# Patient Record
Sex: Female | Born: 2000 | Race: White | Hispanic: No | Marital: Single | Smoking: Never smoker
Health system: Southern US, Community
[De-identification: ages and names within clinical notes are randomized; demographics above are authoritative.]

## PROBLEM LIST (undated history)

## (undated) DIAGNOSIS — J45909 Unspecified asthma, uncomplicated: Secondary | ICD-10-CM

---
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
## 2021-10-02 IMAGING — CR DG CHEST 2V
1 series · 2 of 2 positions shown · non-contrast
Comparison: None.

CLINICAL DATA: Chest pain.

EXAM:
CHEST - 2 VIEW

[Series 1: dg chest 2 view · 0.14mm/px · 2 of 2 slices shown]
[im 1/2]
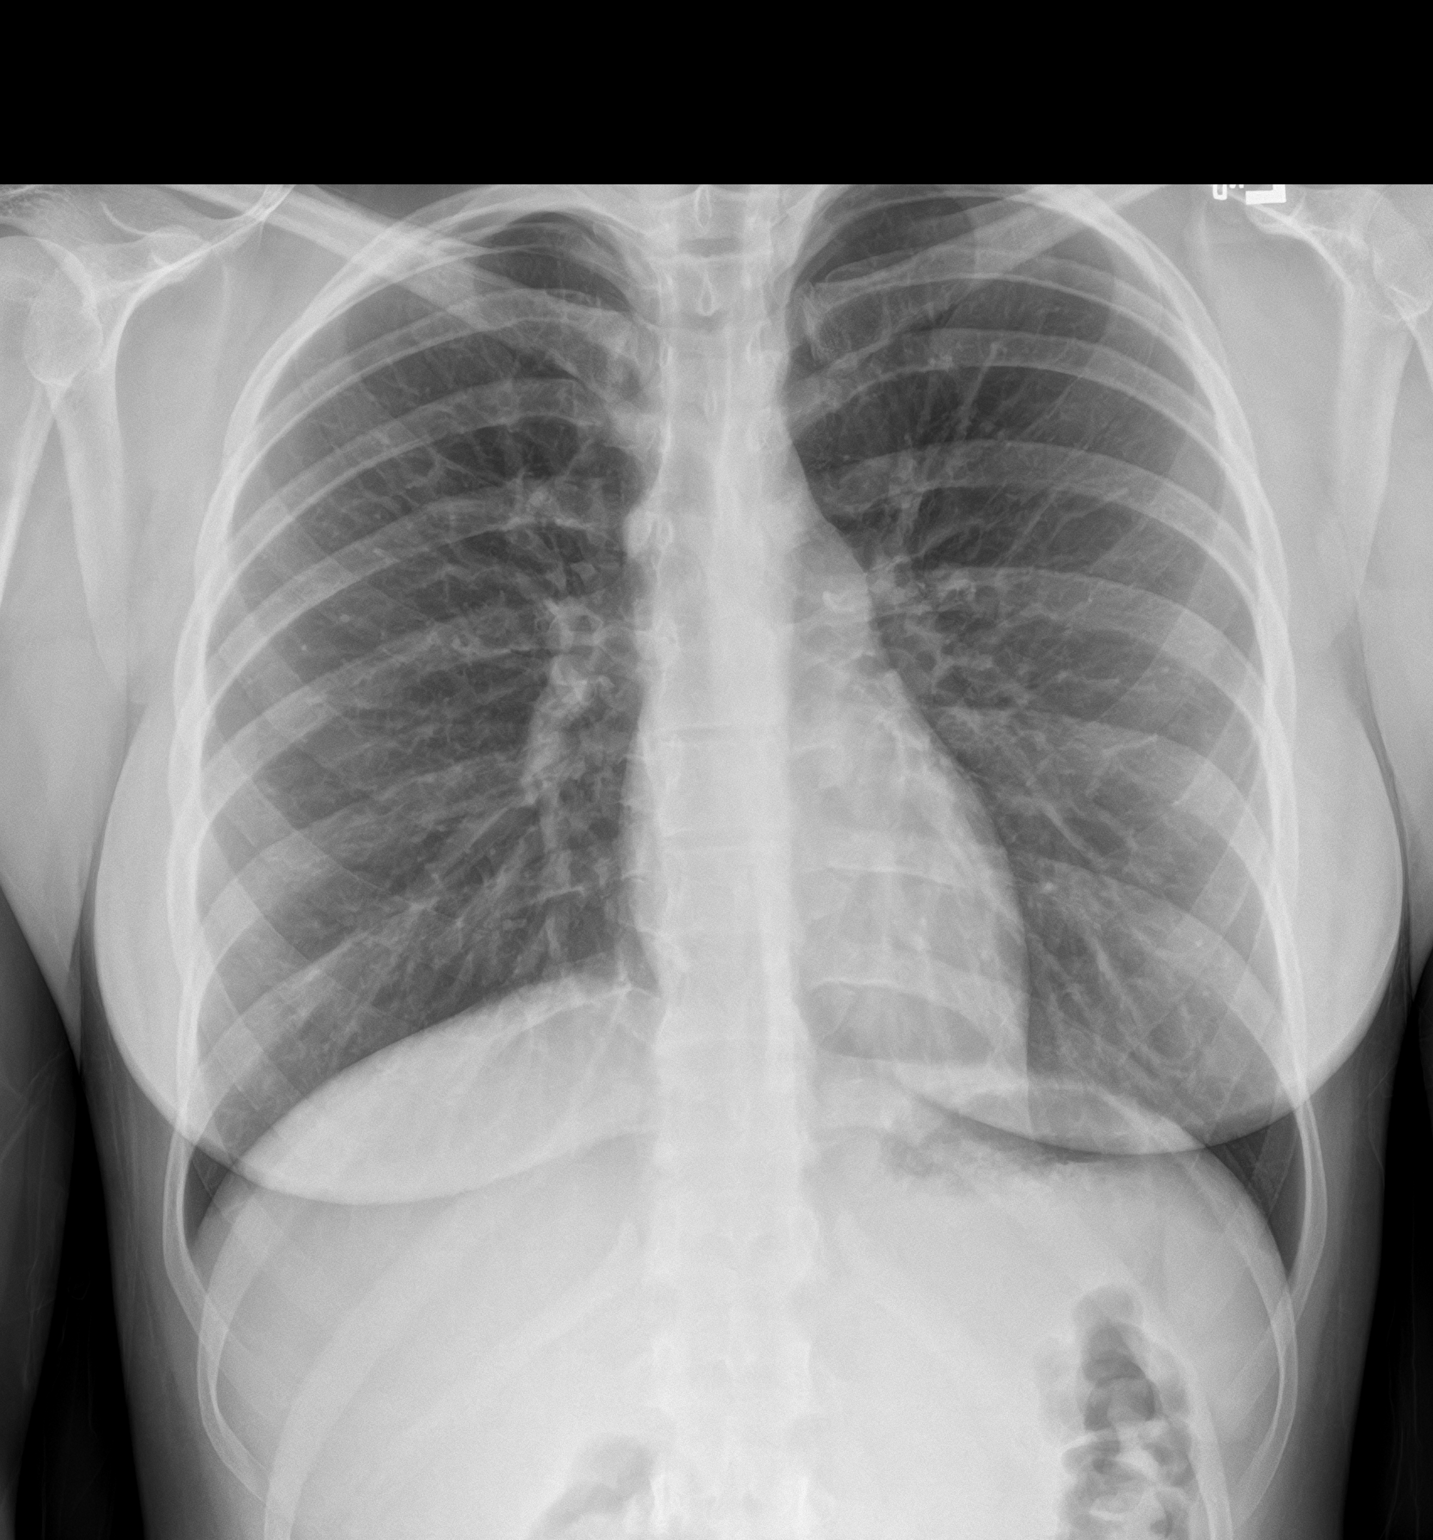
[im 2/2]
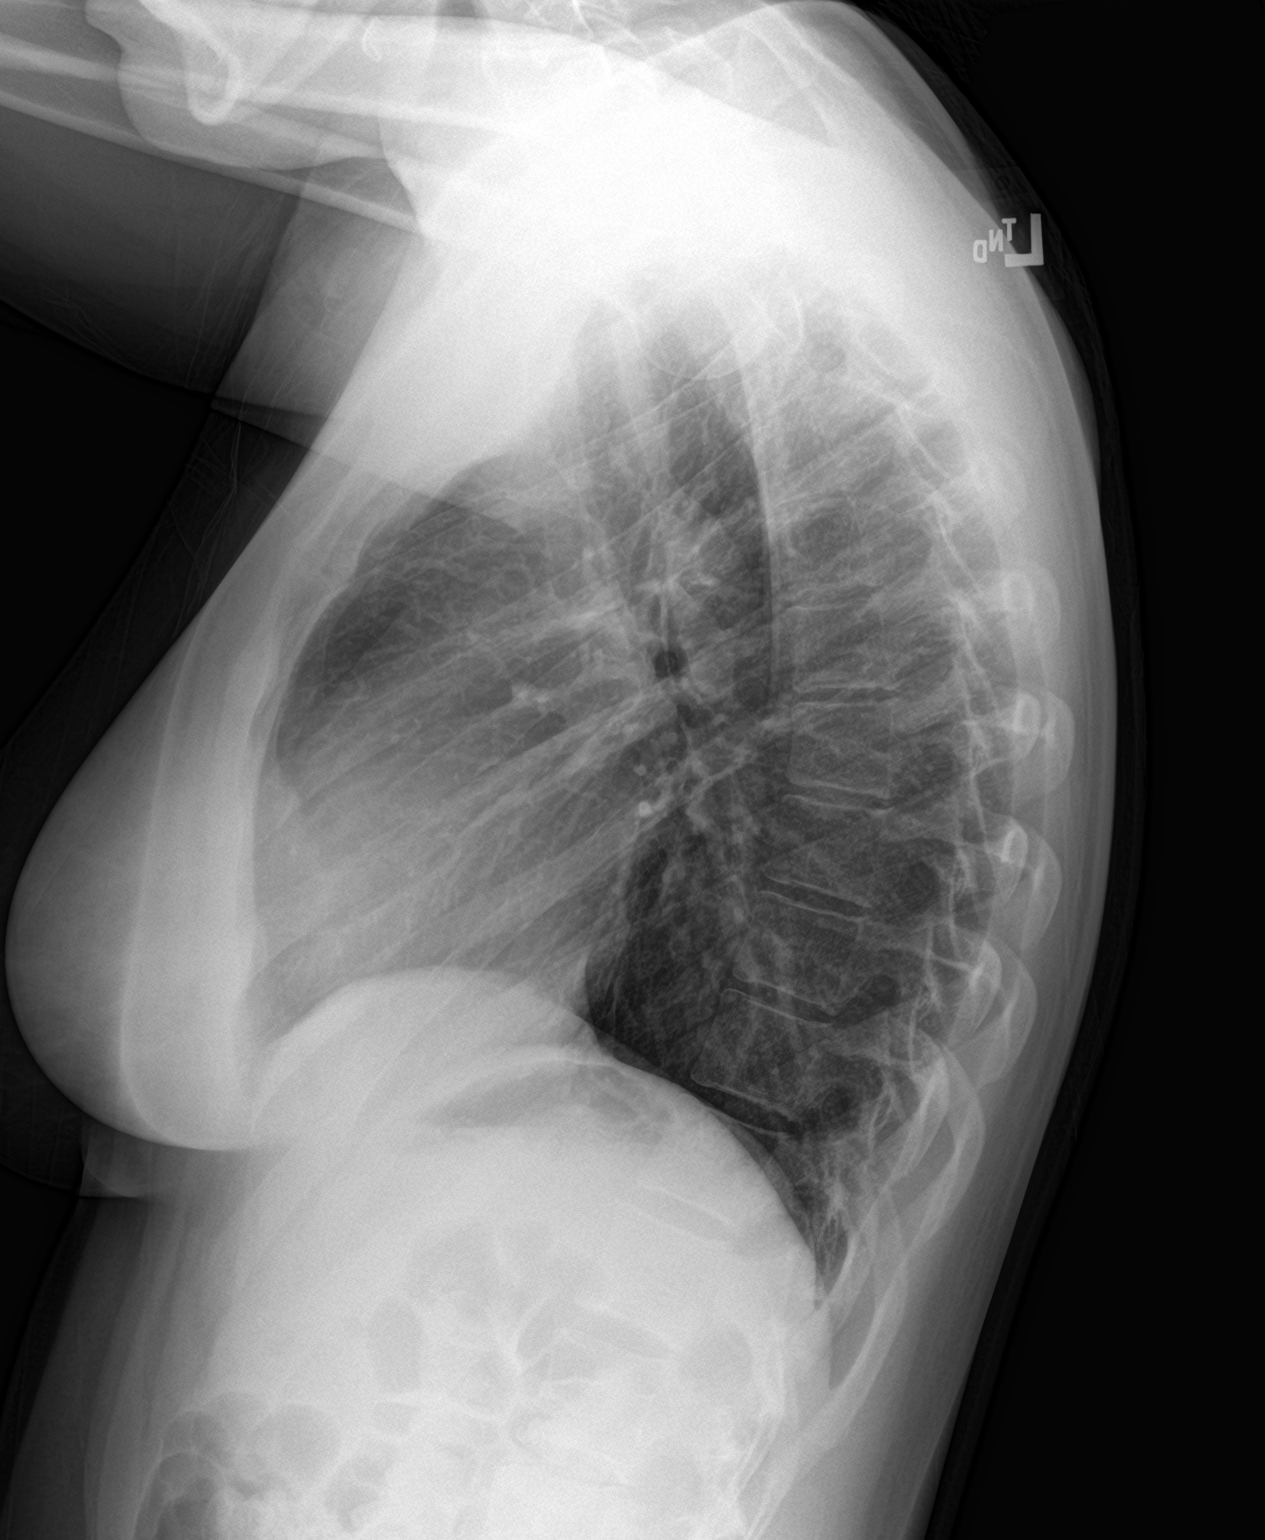

[2 of 2 positions shown; findings below may reference images not displayed]

FINDINGS: The heart size and mediastinal contours are within normal limits.
Both lungs are clear. The visualized skeletal structures are
unremarkable.
IMPRESSION: No active cardiopulmonary disease.

## 2022-09-12 ENCOUNTER — Encounter: Payer: Self-pay | Admitting: Emergency Medicine

## 2022-09-12 ENCOUNTER — Emergency Department: Payer: Managed Care, Other (non HMO)

## 2022-09-12 ENCOUNTER — Emergency Department
Admission: EM | Admit: 2022-09-12 | Discharge: 2022-09-13 | Disposition: A | Discharge status: Home / Self Care | Payer: Managed Care, Other (non HMO) | Attending: Emergency Medicine | Admitting: Emergency Medicine

## 2022-09-12 DIAGNOSIS — S61210A Laceration without foreign body of right index finger without damage to nail, initial encounter: Secondary | ICD-10-CM | POA: Diagnosis not present

## 2022-09-12 DIAGNOSIS — J45909 Unspecified asthma, uncomplicated: Secondary | ICD-10-CM | POA: Diagnosis not present

## 2022-09-12 DIAGNOSIS — W25XXXA Contact with sharp glass, initial encounter: Secondary | ICD-10-CM | POA: Diagnosis not present

## 2022-09-12 DIAGNOSIS — S6991XA Unspecified injury of right wrist, hand and finger(s), initial encounter: Secondary | ICD-10-CM | POA: Diagnosis present

## 2022-09-12 NOTE — ED Triage Notes (Signed)
Pt presents ambulatory to triage via POV with complaints of R index finger laceration after picking up a glass bottle. She notes getting a tetanus shot in December 2023. Rates the pain 4/10. ~1inch laceration near her knuckle. A&Ox4 at this time. Denies CP or SOB.

## 2022-09-12 NOTE — ED Provider Notes (Incomplete)
   [facility] Provider Note    Event Date/Time   First MD Initiated Contact with Patient 09/12/22 2350     (approximate)   History   Laceration   HPI  Natalie Jimenez is a 22 y.o. female with past medical history of asthma who presents with laceration to the right hand.  Laceration occurred when she was picking up a glass bottle, that she did not realize it was broken.  Laceration is on the ulnar side of the distal proximal phalanx.  She did not wash it off prior to coming.  Patient says last tetanus shot was December 2023.    Past Medical History:  Diagnosis Date  . Asthma     There are no problems to display for this patient.    Physical Exam  Triage Vital Signs: ED Triage Vitals  Enc Vitals Group     BP 09/12/22 2317 (!) 139/90     Pulse Rate 09/12/22 2317 (!) 102     Resp 09/12/22 2317 18     Temp 09/12/22 2317 98 F (36.7 C)     Temp Source 09/12/22 2317 Oral     SpO2 09/12/22 2317 100 %     Weight 09/12/22 2316 135 lb (61.2 kg)     Height 09/12/22 2316 5\' 5"  (1.651 m)     Head Circumference --      Peak Flow --      Pain Score 09/12/22 2316 4     Pain Loc --      Pain Edu? --      Excl. in [place]? --     Most recent vital signs: Vitals:   09/12/22 2317  BP: (!) 139/90  Pulse: (!) 102  Resp: 18  Temp: 98 F (36.7 C)  SpO2: 100%     General: Awake, no distress.  CV:  Good peripheral perfusion.  Resp:  Normal effort.  Abd:  No distention.  Neuro:             Awake, Alert, Oriented x 3  Other:  Small approximately 1.5 cm superficial laceration on the dorsal ulnar side of the proximal phalanx Intact flexion at PIP and DIP   ED Results / Procedures / Treatments  Labs (all labs ordered are listed, but only abnormal results are displayed) Labs Reviewed - No data to display   EKG  I reviewed interpret the x-ray of the right hand which is negative for foreign body or fracture   RADIOLOGY    PROCEDURES:  Critical Care  performed: No  Procedures \  [place] ED: Medications - No data to display   IMPRESSION / MDM / [place] / ED COURSE  I reviewed the triage vital signs and the nursing notes.                              Patient's presentation is most consistent with acute, uncomplicated illness.  Differential diagnosis includes, but is not limited to, laceration, foreign body, tendon injury       FINAL CLINICAL IMPRESSION(S) / ED DIAGNOSES   Final diagnoses:  None     Rx / DC Orders   ED Discharge Orders     None        Note:  This document was prepared using Dragon voice recognition software and may include unintentional dictation errors.

## 2022-09-12 NOTE — ED Notes (Signed)
ED Provider at bedside. 

## 2022-09-12 NOTE — ED Provider Notes (Signed)
[facility] Provider Note    Event Date/Time   First MD Initiated Contact with Patient 09/12/22 2350     (approximate)   History   Laceration   HPI  Natalie Jimenez is a 23 y.o. female with past medical history of asthma who presents with laceration to the right hand.  Laceration occurred when she was picking up a glass bottle, that she did not realize it was broken.  Laceration is on the ulnar side of the distal proximal phalanx.  She did not wash it off prior to coming.  Patient says last tetanus shot was December 2023.    Past Medical History:  Diagnosis Date   Asthma     There are no problems to display for this patient.    Physical Exam  Triage Vital Signs: ED Triage Vitals  Enc Vitals Group     BP 09/12/22 2317 (!) 139/90     Pulse Rate 09/12/22 2317 (!) 102     Resp 09/12/22 2317 18     Temp 09/12/22 2317 98 F (36.7 C)     Temp Source 09/12/22 2317 Oral     SpO2 09/12/22 2317 100 %     Weight 09/12/22 2316 135 lb (61.2 kg)     Height 09/12/22 2316 5\' 5"  (1.651 m)     Head Circumference --      Peak Flow --      Pain Score 09/12/22 2316 4     Pain Loc --      Pain Edu? --      Excl. in [place]? --     Most recent vital signs: Vitals:   09/12/22 2317  BP: (!) 139/90  Pulse: (!) 102  Resp: 18  Temp: 98 F (36.7 C)  SpO2: 100%     General: Awake, no distress.  CV:  Good peripheral perfusion.  Resp:  Normal effort.  Abd:  No distention.  Neuro:             Awake, Alert, Oriented x 3  Other:  Small approximately 1.5 cm superficial laceration on the dorsal ulnar side of the proximal phalanx Intact flexion at PIP and DIP   ED Results / Procedures / Treatments  Labs (all labs ordered are listed, but only abnormal results are displayed) Labs Reviewed - No data to display   EKG  I reviewed interpret the x-ray of the right hand which is negative for foreign body or fracture   RADIOLOGY    PROCEDURES:  Critical Care  performed: No  Procedures \  [place] ED: Medications - No data to display   IMPRESSION / MDM / [place] / ED COURSE  I reviewed the triage vital signs and the nursing notes.                              Patient's presentation is most consistent with acute, uncomplicated illness.  Differential diagnosis includes, but is not limited to, laceration, foreign body, tendon injury  Patient is a 22 year old female who presents with a superficial laceration to the right proximal phalanx after she cut it on a glass bottle.  This happened about an hour prior to arrival.  She is up-to-date on tetanus.  On exam she has a small about half centimeter fairly superficial laceration that has some oozing on the ulnar side of the proximal phalanx dorsally.  She is neurovascularly intact no evidence  of tendon injury.  X-ray obtained is negative for foreign body.  I did irrigate the wound under the sink.  Bleeding stopped after pressure.  Given how superficial it is I did just use skin glue.  Discussed return precautions for infection.  Patient is appropriate to be discharged.      FINAL CLINICAL IMPRESSION(S) / ED DIAGNOSES   Final diagnoses:  Laceration of right index finger without foreign body without damage to nail, initial encounter     Rx / DC Orders   ED Discharge Orders     None        Note:  This document was prepared using Dragon voice recognition software and may include unintentional dictation errors.   Rada Hay, MD 09/13/22 [phone]
